# Patient Record
Sex: Male | Born: 1995
Health system: Southern US, Community
[De-identification: ages and names within clinical notes are randomized; demographics above are authoritative.]

## PROBLEM LIST (undated history)

## (undated) DIAGNOSIS — K5090 Crohn's disease, unspecified, without complications: Secondary | ICD-10-CM

## (undated) DIAGNOSIS — Z87820 Personal history of traumatic brain injury: Secondary | ICD-10-CM

## (undated) DIAGNOSIS — F419 Anxiety disorder, unspecified: Secondary | ICD-10-CM

## (undated) DIAGNOSIS — Z5189 Encounter for other specified aftercare: Secondary | ICD-10-CM

## (undated) HISTORY — DX: Anxiety disorder, unspecified: F41.9

---
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
## 2013-06-16 ENCOUNTER — Ambulatory Visit (HOSPITAL_COMMUNITY): Payer: BC Managed Care – PPO | Admitting: Behavioral Health

## 2013-07-08 ENCOUNTER — Ambulatory Visit (INDEPENDENT_AMBULATORY_CARE_PROVIDER_SITE_OTHER): Payer: BC Managed Care – PPO | Admitting: Behavioral Health

## 2013-07-08 ENCOUNTER — Encounter (HOSPITAL_COMMUNITY): Payer: Self-pay | Admitting: Behavioral Health

## 2013-07-08 ENCOUNTER — Encounter (INDEPENDENT_AMBULATORY_CARE_PROVIDER_SITE_OTHER): Payer: Self-pay

## 2013-07-08 DIAGNOSIS — F411 Generalized anxiety disorder: Secondary | ICD-10-CM

## 2013-07-08 NOTE — Progress Notes (Signed)
Presenting Problem Chief Complaint: The client presents with anxiety. He started his freshman year at school and was there 2 weeks and felt that he could not deal with the stress related to that. 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 He indicates that he knows his short-term and working there for one month has let him to want to go back to school but he is miserable and his current job. He does report that he has a tendency to catastrophize in his thinking which creates additional anxiety. The client is working a part-time job while deciding where to go back to school. He indicates that he gets up in the morning with a dread of going to work. He has decided that he will return to school has applied to [employer] [place] at [place] and [place]. We did speak with his mother after the session he was in favor of him possibly finding another part-time job as he will begin school probably in January of 2015.  What are the main stressors in your life right now? Anxiety   2  How long have you had these symptoms?: On and off for several years but had increased in intensity since August of 2014   Previous mental health services Have you ever been treated for a mental health problem? No  If Yes, when?  , where? , by whom?    Are you currently seeing a therapist or counselor? No If Yes, whom?   Have you ever had a mental health hospitalization? No If Yes, when?  , where? , why? , how many times?   Have you ever been treated with medication for a mental health problem? No If Yes, please list as completely as possible (name of medication, reason prescribed, and response:   Have you ever had suicidal thoughts or attempted suicide? No If Yes, when?   Describe   Risk factors for Suicide Demographic factors:  Adolescent or young adult/caucasian Current mental status: No suicidal ideation Loss factors: Decrease in vocational  status Historical factors: No history of suicide ideation or attempts Risk Reduction factors: Living with another person, especially a relative Clinical factors:  Severe Anxiety and/or Agitation Cognitive features that contribute to risk:     SUICIDE RISK:  Minimal: No identifiable suicidal ideation.  Patients presenting with no risk factors but with morbid ruminations; may be classified as minimal risk based on the severity of the depressive symptoms   Medical history Medical treatment and/or problems: See note in epic If Yes, please explain  Name of primary care physician/last physical exam: Dr. Alto Denver  Chronic pain issues: No If Yes, please explain  Allergies: None reported If yes, what medications are you allergic to and what happened when taking the medication? See note in epic   Current medications: See note in epic Prescribed by:   Is there any history of mental health problems or substance abuse in your family? None reported If Yes, please explain (include information on parents, siblings, aunts/uncles, grandparents, cousins, etc.):  Has anyone in your family been hospitalized for mental health problems?  If Yes, please explain (including who, where, and for what length of time):    Social/family history Who lives in your current household? The client, his mother, and his father. He has an older sister who is in college and her older brother who is out of college  Military history: Have you ever been in the  military? No If Yes, when?  for how long?   Were you ever in active combat?  If Yes, when?  for how long?  Were there any lasting effects on you?  If Yes, please explain:   Religious/spiritual involvement:  What Religion are you? Not discussed  Family of origin (childhood history)  Where were you born? [place] Where did you grow up? [place] Describe the household where you grew up: The client reports that he is very family and was nothing significant  were difficult about his childhood Do you have siblings, step/half siblings? Yes If Yes, please list names, sex and ages: An older sister and brother  Are your parents separated/divorced? No If Yes, approximately when?   Are you presently: Single How many times have you been married?  Dates of previous marriages:  Do you have any concerns regarding marriage?  If Yes, please explain:   Do you have any children? No If Yes, how many?  Please list their sexes and ages:   Social supports (personal and professional): The client reports that his family friends and girlfriend are all very supportive Education How many grades have you completed? some college Do you hold any Degrees? No If Yes, in what?   From where?  What were your special talents/interests in school?   Did you have any problems in school? No If Yes, were these problems behavioral, attention, or due to learning difficulties?  Were any medications ever prescribed for these problems?  If Yes, what were the medications?    Employment (financial issues) Do you work? Yes If Yes, what is your occupation? The client works temporarily for a company that [employer] for [employer] long have you been employed there? 4 weeks  Name of employer: [place] Do you enjoy your present job? No What is your previous work history? The client is a part-time jobs while in high school Are you having trouble on your present job or had difficulties holding a job? No If Yes, please explain:    Legal history Do you have any current legal issues? If yes, please describe: None reported  Do you have any [ast legal issues? If yes, please describe: None reported  Trauma/Abuse history: Have you ever been exposed to any form of abuse? None reported If Yes: None reported  Have you ever been exposed to something traumatic? No If yes, please described:    Substance use Do you use Caffeine? Yes If Yes, what type? Coffee soft  drinks How often? A few per week  Do you use Nicotine? No What type?  Packs per day  How many years at this frequency?   Do you use Alcohol? Yes If Yes, what type? Primarily beer Frequency? A few per week  At what age did you take your first drink? 16 Was this accepted by your family? Yes  When was your last drink? A few days ago How much? One beer  Have you ever experienced any form of withdrawal symptoms, i.e., Hallucinations, Tremors, Excessive Sweating, or Nausea or Vomiting? No If Yes, please explain:   Have you ever experienced blackouts? No If Yes, how frequently?   Have you ever had a DWI/DUI? No If Yes, when?   Do you have any legal charges pending involving substance abuse? No If Yes, please explain:   Have you ever used illicit drugs or taken more than prescribed? No If Yes, what type?  Frequency:   Date of last usage:   Have you ever  experienced any withdrawal symptoms as listed above? No If Yes, please explain:   If you are not using presently, have you ever used in the past? No  If Yes, what types of Alcohol or other substances have you used?  Frequency  Last used:   Have you ever received treatment for Alcohol or Substance Abuse problems? No  Inpatient? No Outpatient? No What were the dates of treatment?  Where?   Have you ever been involved in any Recovery or Support Programs? No  If Yes, where?   Are you aware of your triggers to drink or use? No If Yes, please explain:   Mental Status: General Appearance Luretha Murphy:  Neat Eye Contact:  Good Motor Behavior:  Normal Speech:  Normal Level of Consciousness:  Alert Mood:  Anxious Affect:  Appropriate Anxiety Level:  Moderate Thought Process:  Coherent Thought Content:   Perception:  Normal Judgment:  Good Insight:  Present Cognition:  Orientation time Sleep: Client reports no issues with sleep  Diagnosis AXIS I 300.02  AXIS II Deferred  AXIS III Past Medical History  Diagnosis Date   . History of concussion   . Anxiety     AXIS IV educational problems/work  AXIS V 51-60 moderate symptoms    Plan: To work with decline in processing his sources of anxiety and to provide coping skills using cognitive behavioral therapy to reduce anxiety   __________________________________________ Signature/Date

## 2013-07-22 ENCOUNTER — Ambulatory Visit (HOSPITAL_COMMUNITY): Payer: Self-pay | Admitting: Behavioral Health

## 2013-08-05 ENCOUNTER — Ambulatory Visit (HOSPITAL_COMMUNITY): Payer: Self-pay | Admitting: Behavioral Health

## 2013-08-17 ENCOUNTER — Ambulatory Visit (HOSPITAL_COMMUNITY): Payer: Self-pay | Admitting: Psychiatry

## 2013-09-18 ENCOUNTER — Emergency Department (INDEPENDENT_AMBULATORY_CARE_PROVIDER_SITE_OTHER)
Admission: EM | Admit: 2013-09-18 | Discharge: 2013-09-18 | Disposition: A | Discharge status: Home / Self Care | Payer: BC Managed Care – PPO | Source: Home / Self Care | Attending: Family Medicine | Admitting: Family Medicine

## 2013-09-18 ENCOUNTER — Encounter: Payer: Self-pay | Admitting: Emergency Medicine

## 2013-09-18 DIAGNOSIS — J069 Acute upper respiratory infection, unspecified: Secondary | ICD-10-CM

## 2013-09-18 DIAGNOSIS — J029 Acute pharyngitis, unspecified: Secondary | ICD-10-CM

## 2013-09-18 DIAGNOSIS — R509 Fever, unspecified: Secondary | ICD-10-CM

## 2013-09-18 LAB — POCT RAPID STREP A (OFFICE): RAPID STREP A SCREEN: NEGATIVE

## 2013-09-18 LAB — POCT INFLUENZA A/B
INFLUENZA B, POC: NEGATIVE
Influenza A, POC: NEGATIVE

## 2013-09-18 MED ORDER — AZITHROMYCIN 250 MG PO TABS: ORAL_TABLET | ORAL | Status: DC

## 2013-09-18 MED ORDER — BENZONATATE 200 MG PO CAPS: 200.0000 mg | ORAL_CAPSULE | Freq: Every day | ORAL | Status: DC

## 2013-09-18 NOTE — ED Notes (Signed)
Miguel King complains of fevers, chills, body aches and sore throat for 4 days. He has taken Advil and Mucinex with a little relief.

## 2013-09-18 NOTE — Discharge Instructions (Signed)
Take plain Mucinex (1200 mg guaifenesin) twice daily for cough and congestion.  May add Sudafed for sinus congestion.   Increase fluid intake, rest. May use Afrin nasal spray (or generic oxymetazoline) twice daily for about 5 days.  Also recommend using saline nasal spray several times daily and saline nasal irrigation (AYR is a common brand) Try warm salt water gargles for sore throat.  Stop all antihistamines for now, and other non-prescription cough/cold preparations. May take Ibuprofen 200mg , 4 tabs every 8 hours with food for sore throat, body aches, chest discomfort, etc. Begin Azithromycin if not improving about 5 days or if persistent fever develops Follow-up with family doctor if not improving 7 to 10 days.

## 2013-09-18 NOTE — ED Provider Notes (Signed)
CSN: [id number]     Arrival date & time 09/18/13  1157 History   First MD Initiated Contact with Patient 09/18/13 1314     Chief Complaint  Patient presents with  . Generalized Body Aches    x 4 days  . Sore Throat    x 4 days  . Fever    x 4 days      HPI Comments: Four days ago patient developed myalgias and fatigue, and two days ago he developed a mild sore throat and chills.  A non-productive cough then developed.  The history is provided by the patient.    Past Medical History  Diagnosis Date  . History of concussion   . Anxiety    History reviewed. No pertinent past surgical history. History reviewed. No pertinent family history. History  Substance Use Topics  . Smoking status: Never Smoker   . Smokeless tobacco: Never Used  . Alcohol Use: 1.2 oz/week    2 Cans of beer per week    Review of Systems + sore throat + cough No pleuritic pain but has tightness in anterior chest No wheezing No nasal congestion ? post-nasal drainage No sinus pain/pressure No itchy/red eyes No earache No hemoptysis No SOB ? fever, + chills No nausea No vomiting No abdominal pain No diarrhea No urinary symptoms No skin rash + fatigue + myalgias No headache Used OTC meds without relief  Allergies  Review of patient's allergies indicates no known allergies.  Home Medications   Current Outpatient Rx  Name  Route  Sig  Dispense  Refill  . guaiFENesin (MUCINEX) 600 MG 12 hr tablet   Oral   Take by mouth 2 (two) times daily.         Marland Kitchen. ibuprofen (ADVIL,MOTRIN) 200 MG tablet   Oral   Take 200 mg by mouth every 6 (six) hours as needed.         Marland Kitchen. azithromycin (ZITHROMAX Z-PAK) 250 MG tablet      Take 2 tabs today; then begin one tab once daily for 4 more days. (Rx void after 09/26/13)   6 each   0   . benzonatate (TESSALON) 200 MG capsule   Oral   Take 1 capsule (200 mg total) by mouth at bedtime. Take as needed for cough   12 capsule   0    BP 115/75  Pulse  106  Temp(Src) 98.6 F (37 C) (Oral)  Ht 6\' 1"  (1.854 m)  Wt 180 lb (81.647 kg)  BMI 23.75 kg/m2  SpO2 100% Physical Exam Nursing notes and Vital Signs reviewed. Appearance:  Patient appears healthy, stated age, and in no acute distress Eyes:  Pupils are equal, round, and reactive to light and accomodation.  Extraocular movement is intact.  Conjunctivae are not inflamed  Ears:  Canals normal.  Tympanic membranes normal.  Nose:  Mildly congested turbinates.  No sinus tenderness.   Pharynx:  Normal Neck:  Supple.   Tender shotty posterior nodes are palpated bilaterally  Lungs:  Clear to auscultation.  Breath sounds are equal.  Chest:  Distinct tenderness to palpation over the mid-sternum.  Heart:  Regular rate and rhythm without murmurs, rubs, or gallops.  Abdomen:  Nontender without masses or hepatosplenomegaly.  Bowel sounds are present.  No CVA or flank tenderness.  Extremities:  No edema.  No calf tenderness Skin:  No rash present.   ED Course  Procedures  none    Labs Reviewed  POCT RAPID STREP A (OFFICE)  negative  POCT INFLUENZA A/B negative         MDM   1. Fever   2. Sore throat   3. Acute upper respiratory infections of unspecified site; suspect viral URI    There is no evidence of bacterial infection today. Treat symptomatically for now. Prescription written for Benzonatate [facility]) to take at bedtime for night-time cough.  Take plain Mucinex (1200 mg guaifenesin) twice daily for cough and congestion.  May add Sudafed for sinus congestion.   Increase fluid intake, rest. May use Afrin nasal spray (or generic oxymetazoline) twice daily for about 5 days.  Also recommend using saline nasal spray several times daily and saline nasal irrigation (AYR is a common brand) Try warm salt water gargles for sore throat.  Stop all antihistamines for now, and other non-prescription cough/cold preparations. May take Ibuprofen 200mg , 4 tabs every 8 hours with food for sore  throat, body aches, chest discomfort, etc. Begin Azithromycin if not improving about 5 days or if persistent fever develops (Given a prescription to hold, with an expiration date)  Follow-up with family doctor if not improving 7 to 10 days.     Lattie Haw, MD 09/21/13 [phone]

## 2013-10-03 ENCOUNTER — Encounter (HOSPITAL_COMMUNITY): Payer: Self-pay | Admitting: Behavioral Health

## 2013-10-03 NOTE — Progress Notes (Unsigned)
Patient ID: Miguel BrennerHunter King, male   DOB: 1996-03-27, 18 y.o.   MRN: [id number]  Outpatient Therapist Discharge Summary  Miguel BrennerHunter King    1996-03-27   Admission Date: 07/08/13   Discharge Date:  10/03/13 Reason for Discharge:  Client did not return Diagnosis:  Axis I:  300.02  Axis II:  deferred  Axis III:  none reported  Axis IV:    Axis V:  ***  Comments:    French Anaraig M Leiani Enright

## 2015-02-02 ENCOUNTER — Encounter: Payer: Self-pay | Admitting: Emergency Medicine

## 2015-02-02 ENCOUNTER — Emergency Department
Admission: EM | Admit: 2015-02-02 | Discharge: 2015-02-02 | Disposition: A | Discharge status: Home / Self Care | Payer: BLUE CROSS/BLUE SHIELD | Source: Home / Self Care | Attending: Emergency Medicine | Admitting: Emergency Medicine

## 2015-02-02 DIAGNOSIS — W57XXXA Bitten or stung by nonvenomous insect and other nonvenomous arthropods, initial encounter: Secondary | ICD-10-CM | POA: Diagnosis not present

## 2015-02-02 DIAGNOSIS — R21 Rash and other nonspecific skin eruption: Secondary | ICD-10-CM | POA: Diagnosis not present

## 2015-02-02 DIAGNOSIS — T148 Other injury of unspecified body region: Secondary | ICD-10-CM

## 2015-02-02 MED ORDER — DOXYCYCLINE HYCLATE 100 MG PO CAPS: 100.0000 mg | ORAL_CAPSULE | Freq: Two times a day (BID) | ORAL | Status: DC

## 2015-02-02 NOTE — ED Provider Notes (Signed)
CSN: [id number]     Arrival date & time 02/02/15  1023 History   First MD Initiated Contact with Patient 02/02/15 1054     Chief Complaint  Patient presents with  . Insect Bite   (Consider location/radiation/quality/duration/timing/severity/associated sxs/prior Treatment) Patient is a 19 y.o. male presenting with rash. The history is provided by the patient. No language interpreter was used.  Rash Location:  Leg Leg rash location:  R upper leg Quality: redness and swelling   Severity:  Moderate Onset quality:  Gradual Duration:  1 day Timing:  Constant Progression:  Worsening Chronicity:  New Context: sick contacts   Relieved by:  Nothing Ineffective treatments:  None tried Associated symptoms: no fever and no nausea     Past Medical History  Diagnosis Date  . History of concussion   . Anxiety    History reviewed. No pertinent past surgical history. History reviewed. No pertinent family history. History  Substance Use Topics  . Smoking status: Never Smoker   . Smokeless tobacco: Never Used  . Alcohol Use: 1.2 oz/week    2 Cans of beer per week    Review of Systems  Constitutional: Negative for fever.  Gastrointestinal: Negative for nausea.  Skin: Positive for rash.  All other systems reviewed and are negative.   Allergies  Review of patient's allergies indicates no known allergies.  Home Medications   Prior to Admission medications   Medication Sig Start Date End Date Taking? Authorizing Provider  azithromycin (ZITHROMAX Z-PAK) 250 MG tablet Take 2 tabs today; then begin one tab once daily for 4 more days. (Rx void after 09/26/13) 09/18/13   Lattie HawStephen A Beese, MD  benzonatate (TESSALON) 200 MG capsule Take 1 capsule (200 mg total) by mouth at bedtime. Take as needed for cough 09/18/13   Lattie HawStephen A Beese, MD  doxycycline (VIBRAMYCIN) 100 MG capsule Take 1 capsule (100 mg total) by mouth 2 (two) times daily. 02/02/15   Elson AreasLeslie K Shone Leventhal, PA-C  guaiFENesin (MUCINEX) 600 MG  12 hr tablet Take by mouth 2 (two) times daily.    Historical Provider, MD  ibuprofen (ADVIL,MOTRIN) 200 MG tablet Take 200 mg by mouth every 6 (six) hours as needed.    Historical Provider, MD   BP 103/68 mmHg  Pulse 74  Temp(Src) 98.2 F (36.8 C) (Oral)  Resp 16  Ht 6\' 1"  (1.854 m)  Wt 170 lb (77.111 kg)  BMI 22.43 kg/m2  SpO2 100% Physical Exam  Constitutional: He is oriented to person, place, and time. He appears well-developed and well-nourished.  HENT:  Head: Normocephalic.  Eyes: EOM are normal.  Neck: Normal range of motion.  Pulmonary/Chest: Effort normal.  Abdominal: He exhibits no distension.  Musculoskeletal: He exhibits tenderness.  12 cm red area with 5dm white area with red center (bull'seye)   Neurological: He is alert and oriented to person, place, and time.  Psychiatric: He has a normal mood and affect.  Nursing note and vitals reviewed.   ED Course  Procedures (including critical care time) Labs Review Labs Reviewed - No data to display  Imaging Review No results found.   MDM  Large target lesion.   Pt feels well.   I will treat with doxycycline Pt advised to see his MD or recheck in 1 week   1. Insect bite   2. Rash and nonspecific skin eruption     AVS Doxycycline   Elson AreasLeslie K Mackinze Criado, PA-C 02/02/15 1144

## 2015-02-02 NOTE — ED Notes (Signed)
Awoke this morning and notice lump on back of upper right thigh; visualized large reddened area at site.

## 2015-02-02 NOTE — Discharge Instructions (Signed)
Tick Bite Information Ticks are insects that attach themselves to the skin and draw blood for food. There are various types of ticks. Common types include wood ticks and deer ticks. Most ticks live in shrubs and grassy areas. Ticks can climb onto your body when you make contact with leaves or grass where the tick is waiting. The most common places on the body for ticks to attach themselves are the scalp, neck, armpits, waist, and groin. Most tick bites are harmless, but sometimes ticks carry germs that cause diseases. These germs can be spread to a person during the tick's feeding process. The chance of a disease spreading through a tick bite depends on:   The type of tick.  Time of year.   How long the tick is attached.   Geographic location.  HOW CAN YOU PREVENT TICK BITES? Take these steps to help prevent tick bites when you are outdoors:  Wear protective clothing. Long sleeves and long pants are best.   Wear white clothes so you can see ticks more easily.  Tuck your pant legs into your socks.   If walking on a trail, stay in the middle of the trail to avoid brushing against bushes.  Avoid walking through areas with long grass.  Put insect repellent on all exposed skin and along boot tops, pant legs, and sleeve cuffs.   Check clothing, hair, and skin repeatedly and before going inside.   Brush off any ticks that are not attached.  Take a shower or bath as soon as possible after being outdoors.  WHAT IS THE PROPER WAY TO REMOVE A TICK? Ticks should be removed as soon as possible to help prevent diseases caused by tick bites. 1. If latex gloves are available, put them on before trying to remove a tick.  2. Using fine-point tweezers, grasp the tick as close to the skin as possible. You may also use curved forceps or a tick removal tool. Grasp the tick as close to its head as possible. Avoid grasping the tick on its body. 3. Pull gently with steady upward pressure until  the tick lets go. Do not twist the tick or jerk it suddenly. This may break off the tick's head or mouth parts. 4. Do not squeeze or crush the tick's body. This could force disease-carrying fluids from the tick into your body.  5. After the tick is removed, wash the bite area and your hands with soap and water or other disinfectant such as alcohol. 6. Apply a small amount of antiseptic cream or ointment to the bite site.  7. Wash and disinfect any instruments that were used.  Do not try to remove a tick by applying a hot match, petroleum jelly, or fingernail polish to the tick. These methods do not work and may increase the chances of disease being spread from the tick bite.  WHEN SHOULD YOU SEEK MEDICAL CARE? Contact your health care provider if you are unable to remove a tick from your skin or if a part of the tick breaks off and is stuck in the skin.  After a tick bite, you need to be aware of signs and symptoms that could be related to diseases spread by ticks. Contact your health care provider if you develop any of the following in the days or weeks after the tick bite:  Unexplained fever.  Rash. A circular rash that appears days or weeks after the tick bite may indicate the possibility of Lyme disease. The rash may resemble  a target with a bull's-eye and may occur at a different part of your body than the tick bite.  Redness and swelling in the area of the tick bite.   Tender, swollen lymph glands.   Diarrhea.   Weight loss.   Cough.   Fatigue.   Muscle, joint, or bone pain.   Abdominal pain.   Headache.   Lethargy or a change in your level of consciousness.  Difficulty walking or moving your legs.   Numbness in the legs.   Paralysis.  Shortness of breath.   Confusion.   Repeated vomiting.  Document Released: 08/15/2000 Document Revised: 06/08/2013 Document Reviewed: 01/26/2013 [facility] Patient Information 2015 [place], [place]. This information is  not intended to replace advice given to you by your health care provider. Make sure you discuss any questions you have with your health care provider. Insect Bite Mosquitoes, flies, fleas, bedbugs, and many other insects can bite. Insect bites are different from insect stings. A sting is when venom is injected into the skin. Some insect bites can transmit infectious diseases. SYMPTOMS  Insect bites usually turn red, swell, and itch for 2 to 4 days. They often go away on their own. TREATMENT  Your caregiver may prescribe antibiotic medicines if a bacterial infection develops in the bite. HOME CARE INSTRUCTIONS  Do not scratch the bite area.  Keep the bite area clean and dry. Wash the bite area thoroughly with soap and water.  Put ice or cool compresses on the bite area.  Put ice in a plastic bag.  Place a towel between your skin and the bag.  Leave the ice on for 20 minutes, 4 times a day for the first 2 to 3 days, or as directed.  You may apply a baking soda paste, cortisone cream, or calamine lotion to the bite area as directed by your caregiver. This can help reduce itching and swelling.  Only take over-the-counter or prescription medicines as directed by your caregiver.  If you are given antibiotics, take them as directed. Finish them even if you start to feel better. You may need a tetanus shot if:  You cannot remember when you had your last tetanus shot.  You have never had a tetanus shot.  The injury broke your skin. If you get a tetanus shot, your arm may swell, get red, and feel warm to the touch. This is common and not a problem. If you need a tetanus shot and you choose not to have one, there is a rare chance of getting tetanus. Sickness from tetanus can be serious. SEEK IMMEDIATE MEDICAL CARE IF:  8. You have increased pain, redness, or swelling in the bite area. 9. You see a red line on the skin coming from the bite. 10. You have a fever. 11. You have joint  pain. 12. You have a headache or neck pain. 13. You have unusual weakness. 14. You have a rash. 15. You have chest pain or shortness of breath. 16. You have abdominal pain, nausea, or vomiting. 17. You feel unusually tired or sleepy. MAKE SURE YOU:   Understand these instructions.  Will watch your condition.  Will get help right away if you are not doing well or get worse. Document Released: 09/25/2004 Document Revised: 11/10/2011 Document Reviewed: 03/19/2011 [facility] Patient Information 2015 [place], [place]. This information is not intended to replace advice given to you by your health care provider. Make sure you discuss any questions you have with your health care provider.

## 2015-02-03 ENCOUNTER — Emergency Department (INDEPENDENT_AMBULATORY_CARE_PROVIDER_SITE_OTHER)
Admission: EM | Admit: 2015-02-03 | Discharge: 2015-02-03 | Disposition: A | Discharge status: Home / Self Care | Payer: BLUE CROSS/BLUE SHIELD | Source: Home / Self Care | Attending: Sports Medicine | Admitting: Sports Medicine

## 2015-02-03 ENCOUNTER — Encounter: Payer: Self-pay | Admitting: Emergency Medicine

## 2015-02-03 DIAGNOSIS — L03115 Cellulitis of right lower limb: Secondary | ICD-10-CM

## 2015-02-03 MED ORDER — CEFTRIAXONE SODIUM 1 G IJ SOLR
1.0000 g | Freq: Once | INTRAMUSCULAR | Status: AC
  Administered 2015-02-03: 1 g via INTRAMUSCULAR

## 2015-02-03 NOTE — ED Provider Notes (Signed)
  Subjective:    CC: Skin rash  HPI: This is a pleasant 19 year old male, [facility] Yesterday he was seen with a rash in the right inner thigh, is a questionable history of a tick bite. He was placed on doxycycline 100 mg twice a day for 10 days, he returns today with worsening of the rash, he has had 3 doses so far of doxycycline. No constitutional symptoms, pain is mild, worsening. He's also noted a lump in his right groin.  Past medical history, Surgical history, Family history not pertinant except as noted below, Social history, Allergies, and medications have been entered into the medical record, reviewed, and no changes needed.   Review of Systems: No fevers, chills, night sweats, weight loss, chest pain, or shortness of breath.   Objective:    General: Well Developed, well nourished, and in no acute distress.  Neuro: Alert and oriented x3, extra-ocular muscles intact, sensation grossly intact.  HEENT: Normocephalic, atraumatic, pupils equal round reactive to light, neck supple, no masses, no lymphadenopathy, thyroid nonpalpable.  Skin: Warm and dry, there is approximately a 5-6 cm area of erythema with mild induration, on the inner thigh with mild lymphangitis and inguinal lymphadenopathy that is minimally tender. There is no bull's-eye rash, I'm unable to palpate any signs of fluctuance. Cardiac: Regular rate and rhythm, no murmurs rubs or gallops, no lower extremity edema.  Respiratory: Clear to auscultation bilaterally. Not using accessory muscles, speaking in full sentences.  Impression and Recommendations:    1. Skin and soft tissue infection, suspect cellulitis with lymphangitis and lymphadenitis. He is well covered for typical cutaneous organisms, with doxycycline. We are going to give him ceftriaxone 1000 g intramuscular, he will do warm compresses every hour, and should be seen within 7 days right any provider.  Monica Bectonhomas J Pricsilla Lindvall, MD 02/03/15 1414

## 2015-02-03 NOTE — ED Notes (Signed)
Pt was seen here yesterday for rash on left leg. States it has gotten worse and is warm red and tender. He has had 3 doses of his doxy.

## 2015-02-03 NOTE — Discharge Instructions (Signed)

## 2015-11-27 ENCOUNTER — Emergency Department
Admission: EM | Admit: 2015-11-27 | Discharge: 2015-11-27 | Disposition: A | Discharge status: Home / Self Care | Payer: BLUE CROSS/BLUE SHIELD | Source: Home / Self Care | Attending: Family Medicine | Admitting: Family Medicine

## 2015-11-27 ENCOUNTER — Encounter: Payer: Self-pay | Admitting: *Deleted

## 2015-11-27 DIAGNOSIS — K921 Melena: Secondary | ICD-10-CM

## 2015-11-27 MED ORDER — HYDROCORTISONE ACETATE 25 MG RE SUPP: 25.0000 mg | Freq: Two times a day (BID) | RECTAL | Status: DC

## 2015-11-27 NOTE — Discharge Instructions (Signed)

## 2015-11-27 NOTE — ED Provider Notes (Signed)
CSN: [id number]     Arrival date & time 11/27/15  1758 History   First MD Initiated Contact with Patient 11/27/15 1925     Chief Complaint  Patient presents with  . Hematochezia      HPI Comments: Patient reports that he had a normal bowel movement today coated with bright red blood.  He denies rectal pain, abdominal or pelvic pain.  No history of hemorrhoids.  His bowel movements have otherwise been normal and regular.  He feels well. He has a past history of resection of pilonidal cyst.  The history is provided by the patient.    Past Medical History  Diagnosis Date  . History of concussion   . Anxiety    History reviewed. No pertinent past surgical history. History reviewed. No pertinent family history. Social History  Substance Use Topics  . Smoking status: Never Smoker   . Smokeless tobacco: Never Used  . Alcohol Use: 1.2 oz/week    2 Cans of beer per week    Review of Systems  Constitutional: Negative for fever, chills, diaphoresis, activity change, appetite change, fatigue and unexpected weight change.  Gastrointestinal: Positive for blood in stool. Negative for nausea, vomiting, abdominal pain, diarrhea, constipation, abdominal distention, anal bleeding and rectal pain.  All other systems reviewed and are negative.   Allergies  Review of patient's allergies indicates no known allergies.  Home Medications   Prior to Admission medications   Not on File   Meds Ordered and Administered this Visit  Medications - No data to display  BP 119/68 mmHg  Pulse 71  Temp(Src) 98.1 F (36.7 C) (Oral)  Resp 16  Ht 6' (1.829 m)  Wt 181 lb (82.101 kg)  BMI 24.54 kg/m2  SpO2 98% No data found.   Physical Exam Nursing notes and Vital Signs reviewed. Appearance:  Patient appears stated age, and in no acute distress Eyes:  Pupils are equal, round, and reactive to light and accomodation.  Extraocular movement is intact.  Conjunctivae are not inflamed  Nose:   Normal Mouth/Pharynx:  Normal Neck:  Supple.  No adenopathy  Lungs:  Clear to auscultation.  Breath sounds are equal.  Moving air well. Heart:  Regular rate and rhythm without murmurs, rubs, or gallops.  Abdomen:  Nontender without masses or hepatosplenomegaly.  Bowel sounds are present.  No CVA or flank tenderness.  Extremities:  No edema.  Skin:  No rash present.   Rectal Exam:  Anus is normal without surrounding erythema.  No external hemorrhoids are present.  No lesions are palpated in the rectal vault.  No pain elicited during digital exam.  Stool is heme positive.  Prostate is not enlarged, and symmetric without tenderness or nodules.  ED Course  Procedures none     MDM   1. Hematochezia; ?etiology.  Rectal exam unremarkable    Rx for [facility] suppositories BID Recommend evaluation by gastroenterologist   Lattie HawStephen A Shanetra Blumenstock, MD 12/03/15 [phone]

## 2015-11-27 NOTE — ED Notes (Signed)
Pt reports BM today with bright red blood. The BM was normal. Denies fever, abd pain, hemorrhoid, or rectal pain.

## 2018-08-30 ENCOUNTER — Other Ambulatory Visit: Payer: Self-pay

## 2018-08-30 ENCOUNTER — Emergency Department
Admission: EM | Admit: 2018-08-30 | Discharge: 2018-08-30 | Disposition: A | Discharge status: Home / Self Care | Payer: BLUE CROSS/BLUE SHIELD | Source: Home / Self Care | Attending: Family Medicine | Admitting: Family Medicine

## 2018-08-30 DIAGNOSIS — M2669 Other specified disorders of temporomandibular joint: Secondary | ICD-10-CM

## 2018-08-30 MED ORDER — PREDNISONE 20 MG PO TABS: ORAL_TABLET | ORAL | 0 refills | Status: DC

## 2018-08-30 NOTE — Discharge Instructions (Addendum)
Apply ice pack for 10 to 15 minutes, 3 to 4 times daily  Continue until pain and swelling decrease.  May take Tylenol for pain, if needed.

## 2018-08-30 NOTE — ED Provider Notes (Signed)
[facility]    CSN: [id number] Arrival date & time: 08/30/18  1727     History   Chief Complaint Chief Complaint  Patient presents with  . Lock jaw    HPI Miguel King is a 22 y.o. male.   Patient reports that he woke up one month ago with his jaw "locked."  He was able to "pop" it free.  Five days ago his jaw locked again, and he has had difficulty fully opening his mouth since then.  He notes pain in his right TMJ area.     Past Medical History:  Diagnosis Date  . Anxiety   . History of concussion     Patient Active Problem List   Diagnosis Date Noted  . FRACTURE, FINGER 03/19/2009  . FINGER SPRAIN 03/19/2009    History reviewed. No pertinent surgical history.     Home Medications    Prior to Admission medications   Medication Sig Start Date End Date Taking? Authorizing Provider  hydrocortisone (ANUSOL-HC) 25 MG suppository Place 1 suppository (25 mg total) rectally 2 (two) times daily. 11/27/15   Lattie HawBeese, Ameena Vesey A, MD  predniSONE (DELTASONE) 20 MG tablet Take one tab by mouth twice daily for 4 days, then one daily. Take with food. 08/30/18   Lattie HawBeese, Mariko Nowakowski A, MD  cetirizine (ZYRTEC) 10 MG tablet Take 10 mg by mouth daily.  09/18/13  [provider]    Family History History reviewed. No pertinent family history.  Social History Social History   Tobacco Use  . Smoking status: Never Smoker  . Smokeless tobacco: Never Used  Substance Use Topics  . Alcohol use: Yes    Alcohol/week: 2.0 standard drinks    Types: 2 Cans of beer per week  . Drug use: No     Allergies   Patient has no known allergies.   Review of Systems Review of Systems  Constitutional: Negative for activity change, appetite change, chills, diaphoresis, fatigue and fever.  HENT: Negative for congestion, ear discharge, ear pain, facial swelling, sinus pressure and trouble swallowing.   Eyes: Negative.   All other systems reviewed and are  negative.    Physical Exam Triage Vital Signs ED Triage Vitals [08/30/18 1749]  Enc Vitals Group     BP 124/81     Pulse Rate 100     Resp 18     Temp 98 F (36.7 C)     Temp Source Oral     SpO2 98 %     Weight 176 lb (79.8 kg)     Height 6\' 2"  (1.88 m)     Head Circumference      Peak Flow      Pain Score 0     Pain Loc      Pain Edu?      Excl. in GC?    No data found.  Updated Vital Signs BP 124/81 (BP Location: Right Arm)   Pulse 100   Temp 98 F (36.7 C) (Oral)   Resp 18   Ht 6\' 2"  (1.88 m)   Wt 79.8 kg   SpO2 98%   BMI 22.60 kg/m   Visual Acuity Right Eye Distance:   Left Eye Distance:   Bilateral Distance:    Right Eye Near:   Left Eye Near:    Bilateral Near:     Physical Exam Vitals signs and nursing note reviewed.  Constitutional:      General: He is not in acute distress.  Appearance: Normal appearance. He is not ill-appearing.  HENT:     Head: Normocephalic.     Right Ear: Tympanic membrane, ear canal and external ear normal.     Left Ear: Tympanic membrane, ear canal and external ear normal.     Ears:     Comments: There is distinct tenderness over the right temporomandibular joint.  Palpation there recreates his pain.      Nose: Nose normal. No congestion.     Mouth/Throat:     Pharynx: Oropharynx is clear.     Comments: Patient has difficulty fully opening his mouth because of right TMJ pain. Eyes:     Pupils: Pupils are equal, round, and reactive to light.  Neck:     Musculoskeletal: Neck supple. No muscular tenderness.  Cardiovascular:     Rate and Rhythm: Normal rate.  Pulmonary:     Effort: Pulmonary effort is normal.  Lymphadenopathy:     Cervical: No cervical adenopathy.  Skin:    General: Skin is warm and dry.     Findings: No rash.  Neurological:     Mental Status: He is alert.      UC Treatments / Results  Labs (all labs ordered are listed, but only abnormal results are displayed) Labs Reviewed - No data to  display  EKG None  Radiology No results found.  Procedures Procedures (including critical care time)  Medications Ordered in UC Medications - No data to display  Initial Impression / Assessment and Plan / UC Course  I have reviewed the triage vital signs and the nursing notes.  Pertinent labs & imaging results that were available during my care of the patient were reviewed by me and considered in my medical decision making (see chart for details).    Begin prednisone burst/taper. Followup with ENT as soon as possible.    Final Clinical Impressions(s) / UC Diagnoses   Final diagnoses:  TMJ locking     Discharge Instructions     Apply ice pack for 10 to 15 minutes, 3 to 4 times daily  Continue until pain and swelling decrease.  May take Tylenol for pain, if needed.    ED Prescriptions    Medication Sig Dispense Auth. Provider   predniSONE (DELTASONE) 20 MG tablet Take one tab by mouth twice daily for 4 days, then one daily. Take with food. 12 tablet Lattie HawBeese, Cyle Kenyon A, MD         Lattie HawBeese, Osamah Schmader A, MD 09/07/18 1059

## 2018-08-30 NOTE — ED Triage Notes (Signed)
Pt stated that about 1 month ago he woke up with his jaw locked.  He was able to "pop" it.  Christmas day his jaw locked again, and he is only able to open his mouth slightly.

## 2018-08-31 ENCOUNTER — Telehealth: Payer: Self-pay | Admitting: *Deleted

## 2018-08-31 NOTE — Telephone Encounter (Signed)
Called pt to notify him that [facility] and there office is closed today. Gave pt there phone number and he will call them on 09/02/2017 to schedule an appt. Pt verbalized understanding.

## 2019-02-22 DIAGNOSIS — Z20828 Contact with and (suspected) exposure to other viral communicable diseases: Secondary | ICD-10-CM | POA: Diagnosis not present

## 2019-03-10 ENCOUNTER — Encounter: Payer: Self-pay | Admitting: Family Medicine

## 2019-03-10 ENCOUNTER — Telehealth (INDEPENDENT_AMBULATORY_CARE_PROVIDER_SITE_OTHER): Payer: BC Managed Care – PPO | Admitting: Family Medicine

## 2019-03-10 VITALS — HR 70 | Ht 74.0 in | Wt 173.0 lb

## 2019-03-10 DIAGNOSIS — R197 Diarrhea, unspecified: Secondary | ICD-10-CM | POA: Diagnosis not present

## 2019-03-10 DIAGNOSIS — R109 Unspecified abdominal pain: Secondary | ICD-10-CM | POA: Diagnosis not present

## 2019-03-10 NOTE — Assessment & Plan Note (Signed)
Unclear etiology at this point.  His initial symptoms particularly triggered by stress sounded more like IBS.  Stress and then certain foods like pizza were a big trigger.  He does get abdominal pain around the time of the bowel movement.  He says the pain can last a couple hours after the bowel movement but then seems to ease off.  But the persistence of the diarrhea over the last couple of months is a little bit unusual for typical IBS D.  Thus I would like to work him up further.  Will check for celiac disease as well as Crohn's disease.  We will check evaluate for nutritional deficiencies as well as anemia.  Also check liver enzymes.

## 2019-03-10 NOTE — Assessment & Plan Note (Signed)
See note above.  We will also do stool cultures to evaluate for C. difficile, bacteria, and Giardia.

## 2019-03-10 NOTE — Progress Notes (Signed)
Spoke w/pt and he stated that this has been going on for months. He stated that he noticed that anytime his is under a lot of stress it seems to be worse. He stated that when he wakes up he goes to the bathroom and about 10 mins later he goes again and this goes on for up to 6x daily. He has attempted to make changes in his diet by eliminating gluten and dairy and it has only been 2 days of this.

## 2019-03-10 NOTE — Progress Notes (Signed)
Virtual Visit via Video Note  I connected with Miguel BrennerHunter King on 03/10/19 at 10:50 AM EDT by a video enabled telemedicine application and verified that I am speaking with the correct person using two identifiers.   I discussed the limitations of evaluation and management by telemedicine and the availability of in person appointments. The patient expressed understanding and agreed to proceed.  Pt was at home and I was in my office for the virtual visit.     New Patient Office Visit  Subjective:  Patient ID: Miguel King, male    DOB: September 20, 1995  Age: 23 y.o. MRN: [id number]  CC:  Chief Complaint  Patient presents with  . Diarrhea    HPI Miguel King presents for Diarrhea.  He has had on and off diarrhea for years.  Normally it would occur when he would get stressed or when he was studying for a test.  But over the last couple months he feels like it is gotten progressively worse.  And over the last month he says every bowel movement has basically been either soft or watery.  He says sometimes when it is watery it actually has a foul over in a most smells like vomit and has sometimes a mucousy character to it.  He denies any fevers chills or sweats.  He occasionally notices a little bit of blood with wiping but nothing significantly in the toilet or the stool.  No family history of Crohn's disease etc.  He says was seen several years ago for some rectal bleeding and had a rectal exam but has never had any type of scope or procedure done.  He is studying for a test and so has been under some stress but does not feel like he is overly stressed.  Past Medical History:  Diagnosis Date  . Anxiety   . History of concussion      History reviewed. No pertinent surgical history.  History reviewed. No pertinent family history.  Social History   Socioeconomic History  . Marital status: Single    Spouse name: Not on file  . Number of children: Not on file  . Years of education: Not on file   . Highest education level: Not on file  Occupational History  . Not on file  Social Needs  . Financial resource strain: Not on file  . Food insecurity    Worry: Not on file    Inability: Not on file  . Transportation needs    Medical: Not on file    Non-medical: Not on file  Tobacco Use  . Smoking status: Never Smoker  . Smokeless tobacco: Never Used  Substance and Sexual Activity  . Alcohol use: Yes    Alcohol/week: 2.0 standard drinks    Types: 2 Cans of beer per week  . Drug use: No  . Sexual activity: Not Currently  Lifestyle  . Physical activity    Days per week: Not on file    Minutes per session: Not on file  . Stress: Not on file  Relationships  . Social Musicianconnections    Talks on phone: Not on file    Gets together: Not on file    Attends religious service: Not on file    Active member of club or organization: Not on file    Attends meetings of clubs or organizations: Not on file    Relationship status: Not on file  . Intimate partner violence    Fear of current or ex partner: Not on file  Emotionally abused: Not on file    Physically abused: Not on file    Forced sexual activity: Not on file  Other Topics Concern  . Not on file  Social History Narrative  . Not on file    ROS Review of Systems  Objective:   Today's Vitals: Pulse 70   Ht 6\' 2"  (1.88 m)   Wt 173 lb (78.5 kg)   BMI 22.21 kg/m   Physical Exam  Assessment & Plan:   Problem List Items Addressed This Visit      Other   Diarrhea - Primary    Unclear etiology at this point.  His initial symptoms particularly triggered by stress sounded more like IBS.  Stress and then certain foods like pizza were a big trigger.  He does get abdominal pain around the time of the bowel movement.  He says the pain can last a couple hours after the bowel movement but then seems to ease off.  But the persistence of the diarrhea over the last couple of months is a little bit unusual for typical IBS D.  Thus I  would like to work him up further.  Will check for celiac disease as well as Crohn's disease.  We will check evaluate for nutritional deficiencies as well as anemia.  Also check liver enzymes.      Relevant Orders   CBC   COMPLETE METABOLIC PANEL WITH GFR   TSH   Hemoglobin A1c   Folate   Sedimentation rate   B12   Celiac Disease Panel   Stool culture   Clostridium difficile Toxin B, Qualitative, Real-Time PCR([facility])   C-reactive protein   Saccharomyces cerevisiae antibodies, IgG and IgA   Fe+TIBC+Fer   Giardia Antigen (Stool)   Abdominal pain    See note above.  We will also do stool cultures to evaluate for C. difficile, bacteria, and Giardia.      Relevant Orders   CBC   COMPLETE METABOLIC PANEL WITH GFR   TSH   Hemoglobin A1c   Folate   Sedimentation rate   B12   Celiac Disease Panel   Stool culture   Clostridium difficile Toxin B, Qualitative, Real-Time PCR([facility])   C-reactive protein   Saccharomyces cerevisiae antibodies, IgG and IgA   Fe+TIBC+Fer   Giardia Antigen (Stool)      Outpatient Encounter Medications as of 03/10/2019  Medication Sig  . [DISCONTINUED] hydrocortisone (ANUSOL-HC) 25 MG suppository Place 1 suppository (25 mg total) rectally 2 (two) times daily.  . [DISCONTINUED] predniSONE (DELTASONE) 20 MG tablet Take one tab by mouth twice daily for 4 days, then one daily. Take with food.   No facility-administered encounter medications on file as of 03/10/2019.     Follow-up: Return if symptoms worsen or fail to improve.     I discussed the assessment and treatment plan with the patient. The patient was provided an opportunity to ask questions and all were answered. The patient agreed with the plan and demonstrated an understanding of the instructions.   The patient was advised to call back or seek an in-person evaluation if the symptoms worsen or if the condition fails to improve as anticipated.     Beatrice Lecher, MD

## 2019-03-11 DIAGNOSIS — R109 Unspecified abdominal pain: Secondary | ICD-10-CM | POA: Diagnosis not present

## 2019-03-11 DIAGNOSIS — R197 Diarrhea, unspecified: Secondary | ICD-10-CM | POA: Diagnosis not present

## 2019-03-11 LAB — IRON,TIBC AND FERRITIN PANEL
%SAT: 16 % (calc) — ABNORMAL LOW (ref 20–48)
Ferritin: 86 ng/mL (ref 38–380)
Iron: 42 ug/dL — ABNORMAL LOW (ref 50–195)
TIBC: 263 mcg/dL (calc) (ref 250–425)

## 2019-03-14 LAB — HEMOGLOBIN A1C
Hgb A1c MFr Bld: 5.1 % of total Hgb (ref <5.7)
Mean Plasma Glucose: 100 (calc)
eAG (mmol/L): 5.5 (calc)

## 2019-03-14 LAB — COMPLETE METABOLIC PANEL WITH GFR
AG Ratio: 1.5 (calc) (ref 1.0–2.5)
ALT: 14 U/L (ref 9–46)
AST: 21 U/L (ref 10–40)
Albumin: 4.4 g/dL (ref 3.6–5.1)
Alkaline phosphatase (APISO): 64 U/L (ref 36–130)
BUN/Creatinine Ratio: 10 (calc) (ref 6–22)
BUN: 19 mg/dL (ref 7–25)
CO2: 24 mmol/L (ref 20–32)
Calcium: 9.8 mg/dL (ref 8.6–10.3)
Chloride: 99 mmol/L (ref 98–110)
Creat: 1.83 mg/dL — ABNORMAL HIGH (ref 0.60–1.35)
GFR, Est African American: 58 mL/min/{1.73_m2} — ABNORMAL LOW (ref >=60)
GFR, Est Non African American: 50 mL/min/{1.73_m2} — ABNORMAL LOW (ref >=60)
Globulin: 3 g/dL (calc) (ref 1.9–3.7)
Glucose, Bld: 81 mg/dL (ref 65–99)
Potassium: 4.6 mmol/L (ref 3.5–5.3)
Sodium: 136 mmol/L (ref 135–146)
Total Bilirubin: 0.5 mg/dL (ref 0.2–1.2)
Total Protein: 7.4 g/dL (ref 6.1–8.1)

## 2019-03-14 LAB — VITAMIN B12: Vitamin B-12: 659 pg/mL (ref 200–1100)

## 2019-03-14 LAB — CBC
HCT: 36.9 % — ABNORMAL LOW (ref 38.5–50.0)
Hemoglobin: 12.6 g/dL — ABNORMAL LOW (ref 13.2–17.1)
MCH: 30.6 pg (ref 27.0–33.0)
MCHC: 34.1 g/dL (ref 32.0–36.0)
MCV: 89.6 fL (ref 80.0–100.0)
MPV: 9.4 fL (ref 7.5–12.5)
Platelets: 306 10*3/uL (ref 140–400)
RBC: 4.12 10*6/uL — ABNORMAL LOW (ref 4.20–5.80)
RDW: 13.1 % (ref 11.0–15.0)
WBC: 4.9 10*3/uL (ref 3.8–10.8)

## 2019-03-14 LAB — CELIAC DISEASE PANEL
(tTG) Ab, IgA: 1 U/mL
(tTG) Ab, IgG: 2 U/mL
Gliadin IgA: 3 Units
Gliadin IgG: 2 Units
Immunoglobulin A: 190 mg/dL (ref 47–310)

## 2019-03-14 LAB — C-REACTIVE PROTEIN: CRP: 12.2 mg/L — ABNORMAL HIGH (ref <8.0)

## 2019-03-14 LAB — SACCHAROMYCES CEREVISIAE ANTIBODIES, IGG AND IGA
SACCHAROMYCES CEREVISIAE AB (ASCA)(IGA): 12.8 U (ref <=20.0)
SACCHAROMYCES CEREVISIAE AB (ASCA)(IGG): 21.7 U — ABNORMAL HIGH (ref <=20.0)

## 2019-03-14 LAB — FOLATE: Folate: 11.6 ng/mL

## 2019-03-14 LAB — TSH: TSH: 1.47 mIU/L (ref 0.40–4.50)

## 2019-03-14 LAB — SEDIMENTATION RATE: Sed Rate: 43 mm/h — ABNORMAL HIGH (ref 0–15)

## 2019-03-15 ENCOUNTER — Ambulatory Visit (INDEPENDENT_AMBULATORY_CARE_PROVIDER_SITE_OTHER): Payer: BC Managed Care – PPO | Admitting: Family Medicine

## 2019-03-15 ENCOUNTER — Other Ambulatory Visit: Payer: Self-pay

## 2019-03-15 ENCOUNTER — Encounter: Payer: Self-pay | Admitting: Gastroenterology

## 2019-03-15 ENCOUNTER — Encounter: Payer: Self-pay | Admitting: Family Medicine

## 2019-03-15 VITALS — BP 106/67 | HR 102 | Ht 74.0 in | Wt 169.0 lb

## 2019-03-15 DIAGNOSIS — R109 Unspecified abdominal pain: Secondary | ICD-10-CM

## 2019-03-15 DIAGNOSIS — R197 Diarrhea, unspecified: Secondary | ICD-10-CM

## 2019-03-15 DIAGNOSIS — R700 Elevated erythrocyte sedimentation rate: Secondary | ICD-10-CM | POA: Diagnosis not present

## 2019-03-15 DIAGNOSIS — E611 Iron deficiency: Secondary | ICD-10-CM | POA: Insufficient documentation

## 2019-03-15 DIAGNOSIS — K625 Hemorrhage of anus and rectum: Secondary | ICD-10-CM

## 2019-03-15 LAB — STOOL CULTURE
MICRO NUMBER:: 657698
MICRO NUMBER:: 657699
MICRO NUMBER:: 657700
SHIGA RESULT:: NOT DETECTED
SPECIMEN QUALITY:: ADEQUATE
SPECIMEN QUALITY:: ADEQUATE
SPECIMEN QUALITY:: ADEQUATE

## 2019-03-15 LAB — CLOSTRIDIUM DIFFICILE TOXIN B, QUALITATIVE, REAL-TIME PCR: Toxigenic C. Difficile by PCR: NOT DETECTED

## 2019-03-15 NOTE — Assessment & Plan Note (Signed)
Suspect IBS, though with labs findings I am concerned about IBD. Will refer to GI for further workup. I am concerned about the low iron and mildly elevated sed rate.  Will refer for further workup and likely colonoscopy.

## 2019-03-15 NOTE — Progress Notes (Signed)
Established Patient Office Visit  Subjective:  Patient ID: Miguel BrennerHunter King, male    DOB: 19-Dec-1995  Age: 23 y.o. MRN: [id number]  CC:  Chief Complaint  Patient presents with  . Results    HPI Miguel King presents for discuss and follow-up results of recent testing for persistent diarrhea.  In summary patient has report on and off diarrhea for years often triggered by stress but more recently over several months his diarrhea had become more persistent and gotten progressively worse.  He says almost every bowel movement now is soft or watery.  Occasionally has a foul odor like vomit and sometimes is more mucus present.  He denies any fever sweats or chills.  He has lost 10 pounds of the last year but feels like it is because he is no longer exercising and working out like he was at one time.  Occasionally will notice a little bit of blood with wiping but says it is normally bright red.  No family history of GI or autoimmune disorders.  He is not vegetarian.  He recently started a vitamin D and B supplement about 2 weeks ago.  He has never not had any type of GI work-up.  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  In fact he says he actually feels better today.  Reports test yesterday he had to go to the bathroom 4 times beforehand and did notice a little blood.   Past Medical History:  Diagnosis Date  . Anxiety   . History of concussion     No past surgical history on file.  No family history on file.  Social History   Socioeconomic History  . Marital status: Single    Spouse name: Not on file  . Number of children: Not on file  . Years of education: Not on file  . Highest education level: Not on file  Occupational History  . Not on file  Social Needs  . Financial resource strain: Not on file  . Food insecurity    Worry: Not on file    Inability: Not on file  . Transportation needs    Medical: Not  on file    Non-medical: Not on file  Tobacco Use  . Smoking status: Never Smoker  . Smokeless tobacco: Never Used  Substance and Sexual Activity  . Alcohol use: Yes    Alcohol/week: 2.0 standard drinks    Types: 2 Cans of beer per week  . Drug use: No  . Sexual activity: Not Currently  Lifestyle  . Physical activity    Days per week: Not on file    Minutes per session: Not on file  . Stress: Not on file  Relationships  . Social Musicianconnections    Talks on phone: Not on file    Gets together: Not on file    Attends religious service: Not on file    Active member of club or organization: Not on file    Attends meetings of clubs or organizations: Not on file    Relationship status: Not on file  . Intimate partner violence    Fear of current or ex partner: Not on file    Emotionally abused: Not on file    Physically abused: Not on file    Forced sexual activity: Not on file  Other Topics Concern  . Not on file  Social History Narrative  . Not  on file    No outpatient medications prior to visit.   No facility-administered medications prior to visit.     No Known Allergies  ROS Review of Systems    Objective:    Physical Exam  Constitutional: He is oriented to person, place, and time. He appears well-developed and well-nourished.  HENT:  Head: Normocephalic and atraumatic.  Cardiovascular: Normal rate, regular rhythm and normal heart sounds.  Pulmonary/Chest: Effort normal and breath sounds normal.  Abdominal: Soft. Bowel sounds are normal. He exhibits no distension and no mass. There is no abdominal tenderness. There is no rebound and no guarding.  Some fullness in the right lower quadrant.   Neurological: He is alert and oriented to person, place, and time.  Skin: Skin is warm and dry.  Psychiatric: He has a normal mood and affect. His behavior is normal.    BP 106/67   Pulse (!) 102   Ht 6\' 2"  (1.88 m)   Wt 169 lb (76.7 kg)   SpO2 100%   BMI 21.70 kg/m  Wt  Readings from Last 3 Encounters:  03/15/19 169 lb (76.7 kg)  03/10/19 173 lb (78.5 kg)  08/30/18 176 lb (79.8 kg)     Health Maintenance Due  Topic Date Due  . HIV Screening  02/13/2009    There are no preventive care reminders to display for this patient.  Lab Results  Component Value Date   TSH 1.47 03/10/2019   Lab Results  Component Value Date   WBC 4.9 03/10/2019   HGB 12.6 (L) 03/10/2019   HCT 36.9 (L) 03/10/2019   MCV 89.6 03/10/2019   PLT 306 03/10/2019   Lab Results  Component Value Date   NA 136 03/10/2019   K 4.6 03/10/2019   CO2 24 03/10/2019   GLUCOSE 81 03/10/2019   BUN 19 03/10/2019   CREATININE 1.83 (H) 03/10/2019   BILITOT 0.5 03/10/2019   AST 21 03/10/2019   ALT 14 03/10/2019   PROT 7.4 03/10/2019   CALCIUM 9.8 03/10/2019   No results found for: CHOL No results found for: HDL No results found for: LDLCALC No results found for: TRIG No results found for: CHOLHDL Lab Results  Component Value Date   HGBA1C 5.1 03/10/2019      Assessment & Plan:   Problem List Items Addressed This Visit      Digestive   Rectal bleeding    occ noticing bright red blood when bleeding        Other   Iron deficiency - Primary    He wants to start  MVI with iron that he has purchased. OK to take. After evaluation if caused determine we may need to increase his iron intake. Plan to recheck iron in 8 weeks.       Relevant Orders   Ambulatory referral to Gastroenterology   Diarrhea    Suspect IBS, though with labs findings I am concerned about IBD. Will refer to GI for further workup. I am concerned about the low iron and mildly elevated sed rate.  Will refer for further workup and likely colonoscopy.        Relevant Orders   Ambulatory referral to Gastroenterology   Abdominal pain   Relevant Orders   Ambulatory referral to Gastroenterology    Other Visit Diagnoses    Elevated sed rate       Relevant Orders   Ambulatory referral to  Gastroenterology      Suspect IBS, though with labs findings  I am concerned about IBD. Will refer to GI for further workup.   No orders of the defined types were placed in this encounter.   Follow-up: No follow-ups on file.    Nani Gasseratherine Metheney, MD

## 2019-03-15 NOTE — Assessment & Plan Note (Signed)
occ noticing bright red blood when bleeding

## 2019-03-15 NOTE — Assessment & Plan Note (Signed)
He wants to start  MVI with iron that he has purchased. OK to take. After evaluation if caused determine we may need to increase his iron intake. Plan to recheck iron in 8 weeks.

## 2019-03-16 ENCOUNTER — Ambulatory Visit: Payer: BC Managed Care – PPO | Admitting: Family Medicine

## 2019-03-30 ENCOUNTER — Other Ambulatory Visit: Payer: Self-pay

## 2019-03-30 ENCOUNTER — Telehealth (INDEPENDENT_AMBULATORY_CARE_PROVIDER_SITE_OTHER): Payer: BC Managed Care – PPO | Admitting: Gastroenterology

## 2019-03-30 ENCOUNTER — Encounter: Payer: Self-pay | Admitting: Gastroenterology

## 2019-03-30 VITALS — Ht 74.0 in | Wt 170.0 lb

## 2019-03-30 DIAGNOSIS — K921 Melena: Secondary | ICD-10-CM | POA: Diagnosis not present

## 2019-03-30 DIAGNOSIS — D509 Iron deficiency anemia, unspecified: Secondary | ICD-10-CM | POA: Diagnosis not present

## 2019-03-30 DIAGNOSIS — R197 Diarrhea, unspecified: Secondary | ICD-10-CM

## 2019-03-30 NOTE — Patient Instructions (Addendum)
If you are age 25 or older, your body mass index should be between 23-30. Your Body mass index is 21.83 kg/m. If this is out of the aforementioned range listed, please consider follow up with your Primary Care Provider.  If you are age 67 or younger, your body mass index should be between 19-25. Your Body mass index is 21.83 kg/m. If this is out of the aformentioned range listed, please consider follow up with your Primary Care Provider.   To help prevent the possible spread of infection to our patients, communities, and staff; we will be implementing the following measures:  As of now we are not allowing any visitors/family members to accompany you to any upcoming appointments with [facility]. If you have any concerns about this please contact our office to discuss prior to the appointment.   We have sent the following medications to your pharmacy for you to pick up at your convenience: Cleniq  It has been recommended to you by your physician that you have a(n) EGD/Colonoscopy completed. Per your request, we did not schedule the procedure(s) today. Please contact our office at [phone] should you decide to have the procedure completed.   It was a pleasure to see you today!  Vito Cirigliano, D.O.

## 2019-03-30 NOTE — Progress Notes (Signed)
Chief Complaint: Iron deficiency anemia, diarrhea, hematochezia  Referring Provider:     Hali Marry, MD   HPI:    Due to current restrictions/limitations of in-office visits due to the COVID-19 pandemic, this scheduled clinical appointment was converted to a telehealth virtual consultation using [employer].  -Time of medical discussion: 24 minutes -The patient did consent to this virtual visit and is aware of possible charges through their insurance for this visit.  -Names of all parties present: Miguel King (patient), Miguel Heck, DO, [facility] (physician) -Patient location: Home -Physician location: Office  Miguel King is a 23 y.o. male referred to the Gastroenterology Clinic for evaluation of iron deficiency anemia and diarrhea.  Has a longstanding history of intermittent watery stools for several years, typically triggered by stress, and worsening in the last several months. Describes 4-5 watery stools/day, +urgency. No nocturnal BM. +tenesmus that occurs during "flares". Does have mucus-like stools more recently.  Occasional small volume BRBPR.  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  Interestingly, reports feeling better since taking that test.  No n/v. Reports 10#weight loss which he attributes to decreased exercise (resistence training). No derm or ophtho issues. Thinks he may have had a hx of anal fissure in the past.   Recent labs notable for H/H 12.6/36.9 with MCV/RDW 89/13, iron 42, iron sat 16%, ferritin 86, TIBC 263.  Normal B12, folate.  Creatinine 1.83 with otherwise normal CMP.  Normal TSH, A1c, Celiac panel.  Elevated ESR (43) and CRP (12.2).  Elevated Saccharomyces cerevisiae.  Negative stool culture, C. difficile.  No prior studies for comparison.  No recent abdominal imaging.  Has started MVI with iron for a couple weeks.   No known family history of CRC, GI malignancy, liver disease, pancreatic disease, or IBD.   No previous  EGD or colonoscopy.  Past medical history, past surgical history, social history, family history, medications, and allergies reviewed in the chart and with patient.    Past Medical History:  Diagnosis Date  . Anxiety   . History of concussion      History reviewed. No pertinent surgical history. Family History  Problem Relation Age of Onset  . Colon cancer Neg Hx    Social History   Tobacco Use  . Smoking status: Never Smoker  . Smokeless tobacco: Never Used  Substance Use Topics  . Alcohol use: Yes    Alcohol/week: 2.0 standard drinks    Types: 2 Cans of beer per week  . Drug use: No   No current outpatient medications on file.   No current facility-administered medications for this visit.    No Known Allergies   Review of Systems: All systems reviewed and negative except where noted in HPI.     Physical Exam:    Complete physical exam not completed due to the nature of this telehealth communication.   Gen: Awake, alert, and oriented, and well communicative. HEENT: EOMI, non-icteric sclera, NCAT, MMM Neck: Normal movement of head and neck Pulm: No labored breathing, speaking in full sentences without conversational dyspnea Derm: No apparent lesions or bruising in visible field MS: Moves all visible extremities without noticeable abnormality Psych: Pleasant, cooperative, normal speech, thought processing seemingly intact   ASSESSMENT AND PLAN;   1) Iron Deficiency Anemia 2) Diarrhea 3) Elevated inflammatory markers 4) Hematochezia  Clinical presentation suspicious for IBD.  Certainly could have element of IBS given the strong association with stress, but  elevated inflammatory markers, hematochezia, IDA, suggestive of the former.  - EGD with biopsies and colonoscopy w/ biopsies -Okay to resume MVI with iron for now, but hold oral iron supplementation x7 days prior to endoscopic evaluation -Additional recommendations (i.e. VCE) pending endoscopic evaluation    The indications, risks, and benefits of EGD and colonoscopy were explained to the patient in detail. Risks include but are not limited to bleeding, perforation, adverse reaction to medications, and cardiopulmonary compromise. Sequelae include but are not limited to the possibility of surgery, hositalization, and mortality. The patient verbalized understanding and wished to proceed. All questions answered, referred to scheduler and bowel prep ordered. Further recommendations pending results of the exam.     Miguel Bullion, DO, FACG  03/30/2019, 1:34 PM   Miguel King, *

## 2023-02-20 ENCOUNTER — Ambulatory Visit
Admission: EM | Admit: 2023-02-20 | Discharge: 2023-02-20 | Disposition: A | Discharge status: Home / Self Care | Payer: 59

## 2023-02-20 ENCOUNTER — Encounter: Payer: Self-pay | Admitting: Emergency Medicine

## 2023-02-20 DIAGNOSIS — L03317 Cellulitis of buttock: Secondary | ICD-10-CM

## 2023-02-20 HISTORY — DX: Crohn's disease, unspecified, without complications: K50.90

## 2023-02-20 MED ORDER — CEPHALEXIN 500 MG PO CAPS: 500.0000 mg | ORAL_CAPSULE | Freq: Two times a day (BID) | ORAL | 0 refills | Status: AC

## 2023-02-20 NOTE — Discharge Instructions (Addendum)
Advised patient of his cellulitis and need to take Keflex twice daily x 7 days with this being the minimum dose of antibiotic for skin infection for his body weight.  Advised patient to take medication with food to completion.  Encouraged increase daily water intake to 64 ounces per day while taking this medication.  Advised if symptoms worsen and/or unresolved please follow-up with PCP or here for further evaluation.

## 2023-02-20 NOTE — ED Provider Notes (Signed)
[facility]    CSN: [id number] Arrival date & time: 02/20/23  6962      History   Chief Complaint Chief Complaint  Patient presents with   Rash    HPI Miguel King is a 27 y.o. male.   HPI Pleasant 27 year old male presents with erythematous skin patch at left top buttock patient noted it on Tuesday, itches intermittently topical cortisone and Benadryl has helped. Patient is immunosuppressed.  PMH significant for Crohn's disease, history of concussion, and rectal bleeding.  Past Medical History:  Diagnosis Date   Anxiety    Crohn's disease (HCC)    History of concussion     Patient Active Problem List   Diagnosis Date Noted   Iron deficiency 03/15/2019   Rectal bleeding 03/15/2019   Diarrhea 03/10/2019   Abdominal pain 03/10/2019   FRACTURE, FINGER 03/19/2009    History reviewed. No pertinent surgical history.     Home Medications    Prior to Admission medications   Medication Sig Start Date End Date Taking? Authorizing Provider  cephALEXin (KEFLEX) 500 MG capsule Take 1 capsule (500 mg total) by mouth 2 (two) times daily for 7 days. 02/20/23 02/27/23 Yes Trevor Iha, FNP  INFLECTRA 100 MG SOLR Administer 400mg  IV @ 0, 2, 6 weeks and every 8 weeks thereafter 11/06/21  Yes [provider]    Family History Family History  Problem Relation Age of Onset   Healthy Mother    Healthy Father    Colon cancer Neg Hx     Social History Social History   Tobacco Use   Smoking status: Never   Smokeless tobacco: Never  Vaping Use   Vaping Use: Never used  Substance Use Topics   Alcohol use: Yes    Alcohol/week: 2.0 standard drinks of alcohol    Types: 2 Cans of beer per week   Drug use: No     Allergies   Patient has no known allergies.   Review of Systems Review of Systems  Skin:  Positive for rash.     Physical Exam Triage Vital Signs ED Triage Vitals  Enc Vitals Group     BP 02/20/23 0844 108/74     Pulse Rate  02/20/23 0844 77     Resp 02/20/23 0844 14     Temp 02/20/23 0844 98.4 F (36.9 C)     Temp Source 02/20/23 0844 Oral     SpO2 02/20/23 0844 100 %     Weight 02/20/23 0846 180 lb (81.6 kg)     Height 02/20/23 0846 6\' 2"  (1.88 m)     Head Circumference --      Peak Flow --      Pain Score 02/20/23 0846 0     Pain Loc --      Pain Edu? --      Excl. in GC? --    No data found.  Updated Vital Signs BP 108/74 (BP Location: Left Arm)   Pulse 77   Temp 98.4 F (36.9 C) (Oral)   Resp 14   Ht 6\' 2"  (1.88 m)   Wt 180 lb (81.6 kg)   SpO2 100%   BMI 23.11 kg/m     Physical Exam Vitals and nursing note reviewed.  Constitutional:      Appearance: Normal appearance. He is normal weight.  HENT:     Head: Normocephalic and atraumatic.     Mouth/Throat:     Mouth: Mucous membranes are moist.  Pharynx: Oropharynx is clear.  Eyes:     Extraocular Movements: Extraocular movements intact.     Conjunctiva/sclera: Conjunctivae normal.     Pupils: Pupils are equal, round, and reactive to light.  Cardiovascular:     Rate and Rhythm: Normal rate and regular rhythm.     Pulses: Normal pulses.     Heart sounds: Normal heart sounds.  Pulmonary:     Effort: Pulmonary effort is normal.     Breath sounds: Normal breath sounds. No wheezing, rhonchi or rales.  Musculoskeletal:        General: Normal range of motion.     Cervical back: Normal range of motion and neck supple.  Skin:    General: Skin is warm and dry.     Comments: Left buttock area (superior lateral aspect): Erythematous, mildly indurated plaque  Neurological:     General: No focal deficit present.     Mental Status: He is alert and oriented to person, place, and time. Mental status is at baseline.  Psychiatric:        Mood and Affect: Mood normal.        Behavior: Behavior normal.      UC Treatments / Results  Labs (all labs ordered are listed, but only abnormal results are displayed) Labs Reviewed - No data to  display  EKG   Radiology No results found.  Procedures Procedures (including critical care time)  Medications Ordered in UC Medications - No data to display  Initial Impression / Assessment and Plan / UC Course  I have reviewed the triage vital signs and the nursing notes.  Pertinent labs & imaging results that were available during my care of the patient were reviewed by me and considered in my medical decision making (see chart for details).     MDM: 1.  Cellulitis of buttocks, left-Rx'd Keflex 500 mg capsule twice daily x 7 days. Advised patient of his cellulitis and need to take Keflex twice daily x 7 days with this being the minimum dose of antibiotic for skin infection for his body weight.  Advised patient to take medication with food to completion.  Encouraged increase daily water intake to 64 ounces per day while taking this medication.  Advised if symptoms worsen and/or unresolved please follow-up with PCP or here for further evaluation.  Patient discharged home, hemodynamically stable.  Final Clinical Impressions(s) / UC Diagnoses   Final diagnoses:  Cellulitis of buttock, left     Discharge Instructions      Advised patient of his cellulitis and need to take Keflex twice daily x 7 days with this being the minimum dose of antibiotic for skin infection for his body weight.  Advised patient to take medication with food to completion.  Encouraged increase daily water intake to 64 ounces per day while taking this medication.  Advised if symptoms worsen and/or unresolved please follow-up with PCP or here for further evaluation.     ED Prescriptions     Medication Sig Dispense Auth. Provider   cephALEXin (KEFLEX) 500 MG capsule Take 1 capsule (500 mg total) by mouth 2 (two) times daily for 7 days. 14 capsule Trevor Iha, FNP      PDMP not reviewed this encounter.   Trevor Iha, FNP 02/20/23 [phone]

## 2023-02-20 NOTE — ED Triage Notes (Signed)
Hive to left top of buttock Pt  noted it on Tuesday Itches intermittently  Topical cortisone and benadryl has helped Itches intermittently Pt is immunosuppressed

## 2023-03-03 ENCOUNTER — Encounter: Payer: Self-pay | Admitting: Emergency Medicine

## 2023-03-03 ENCOUNTER — Ambulatory Visit
Admission: EM | Admit: 2023-03-03 | Discharge: 2023-03-03 | Disposition: A | Discharge status: Home / Self Care | Payer: 59 | Attending: Family Medicine | Admitting: Family Medicine

## 2023-03-03 DIAGNOSIS — R21 Rash and other nonspecific skin eruption: Secondary | ICD-10-CM | POA: Diagnosis not present

## 2023-03-03 MED ORDER — MUPIROCIN 2 % EX OINT: 1.0000 | TOPICAL_OINTMENT | Freq: Two times a day (BID) | CUTANEOUS | 0 refills | Status: AC

## 2023-03-03 MED ORDER — DOXYCYCLINE HYCLATE 100 MG PO CAPS: 100.0000 mg | ORAL_CAPSULE | Freq: Two times a day (BID) | ORAL | 1 refills | Status: AC

## 2023-03-03 NOTE — ED Provider Notes (Signed)
[facility]    CSN: [id number] Arrival date & time: 03/03/23  1122      History   Chief Complaint Chief Complaint  Patient presents with   Rash    HPI Miguel King is a 27 y.o. male.   HPI  Patient was seen about a week ago for skin infection/cellulitis.  Record is reviewed   He did well on the antibiotic and the rash looked clear.  Within a couple of days it has recurred.  Slightly irritated but not painful. Never had this rash before.  Seems worse after being out itn the heat  Past Medical History:  Diagnosis Date   Anxiety    Crohn's disease (HCC)    History of concussion     Patient Active Problem List   Diagnosis Date Noted   Iron deficiency 03/15/2019   Rectal bleeding 03/15/2019   Diarrhea 03/10/2019   Abdominal pain 03/10/2019   FRACTURE, FINGER 03/19/2009    History reviewed. No pertinent surgical history.     Home Medications    Prior to Admission medications   Medication Sig Start Date End Date Taking? Authorizing Provider  doxycycline (VIBRAMYCIN) 100 MG capsule Take 1 capsule (100 mg total) by mouth 2 (two) times daily. 03/03/23  Yes Eustace Moore, MD  mupirocin ointment (BACTROBAN) 2 % Apply 1 Application topically 2 (two) times daily. 03/03/23  Yes Eustace Moore, MD  INFLECTRA 100 MG SOLR Administer 400mg  IV @ 0, 2, 6 weeks and every 8 weeks thereafter 11/06/21   [provider]    Family History Family History  Problem Relation Age of Onset   Healthy Mother    Healthy Father    Colon cancer Neg Hx     Social History Social History   Tobacco Use   Smoking status: Never   Smokeless tobacco: Never  Vaping Use   Vaping Use: Never used  Substance Use Topics   Alcohol use: Yes    Alcohol/week: 2.0 standard drinks of alcohol    Types: 2 Cans of beer per week   Drug use: No     Allergies   Patient has no known allergies.   Review of Systems Review of Systems See HPI  Physical Exam Triage Vital  Signs ED Triage Vitals  Enc Vitals Group     BP 03/03/23 1132 128/86     Pulse Rate 03/03/23 1132 66     Resp 03/03/23 1132 16     Temp 03/03/23 1132 97.9 F (36.6 C)     Temp Source 03/03/23 1132 Oral     SpO2 03/03/23 1132 99 %     Weight --      Height --      Head Circumference --      Peak Flow --      Pain Score 03/03/23 1133 0     Pain Loc --      Pain Edu? --      Excl. in GC? --    No data found.  Updated Vital Signs BP 128/86 (BP Location: Right Arm)   Pulse 66   Temp 97.9 F (36.6 C) (Oral)   Resp 16   SpO2 99%       Physical Exam Constitutional:      General: He is not in acute distress.    Appearance: He is well-developed.  HENT:     Head: Normocephalic and atraumatic.  Eyes:     Conjunctiva/sclera: Conjunctivae normal.  Pupils: Pupils are equal, round, and reactive to light.  Cardiovascular:     Rate and Rhythm: Normal rate.  Pulmonary:     Effort: Pulmonary effort is normal. No respiratory distress.  Musculoskeletal:        General: Normal range of motion.     Cervical back: Normal range of motion.  Skin:    General: Skin is warm and dry.     Findings: Rash present.     Comments: Rash is as pictured, fainter in appearance but slightly larger in diameter.  Erythema and slight induration.  Non tender.  No vesicle or open skin  Neurological:     Mental Status: He is alert.      UC Treatments / Results  Labs (all labs ordered are listed, but only abnormal results are displayed) Labs Reviewed - No data to display  EKG   Radiology No results found.  Procedures Procedures (including critical care time)  Medications Ordered in UC Medications - No data to display  Initial Impression / Assessment and Plan / UC Course  I have reviewed the triage vital signs and the nursing notes.  Pertinent labs & imaging results that were available during my care of the patient were reviewed by me and considered in my medical decision making (see  chart for details).     Will ttry different approach Does not look viral Final Clinical Impressions(s) / UC Diagnoses   Final diagnoses:  Rash and nonspecific skin eruption     Discharge Instructions      I have prescribed 2 medicines for the skin infection 1st: try the mupirocin ointment 2 x a day for a couple of days.  This is a stronger antibiotic ointment that will treat skin infections. 2nd: if you fail to see improvement, or if rash worsens, fill and take the doxycycline.  Take with food.  Continue your probiotics      ED Prescriptions     Medication Sig Dispense Auth. Provider   mupirocin ointment (BACTROBAN) 2 % Apply 1 Application topically 2 (two) times daily. 22 g Eustace Moore, MD   doxycycline (VIBRAMYCIN) 100 MG capsule Take 1 capsule (100 mg total) by mouth 2 (two) times daily. 14 capsule Eustace Moore, MD      PDMP not reviewed this encounter.   Eustace Moore, MD 03/03/23 1159

## 2023-03-03 NOTE — Discharge Instructions (Signed)
I have prescribed 2 medicines for the skin infection 1st: try the mupirocin ointment 2 x a day for a couple of days.  This is a stronger antibiotic ointment that will treat skin infections. 2nd: if you fail to see improvement, or if rash worsens, fill and take the doxycycline.  Take with food.  Continue your probiotics

## 2023-03-03 NOTE — ED Triage Notes (Signed)
Pt seen a few weeks ago for cellulitis to buttocks. States he completed the keflex on Friday and yesterday noticed rash was coming back in same spot. Denies pain but states it is itching.

## 2023-07-17 ENCOUNTER — Ambulatory Visit
Admission: EM | Admit: 2023-07-17 | Discharge: 2023-07-17 | Disposition: A | Discharge status: Home / Self Care | Payer: 59

## 2023-07-17 DIAGNOSIS — N50811 Right testicular pain: Secondary | ICD-10-CM

## 2023-07-17 HISTORY — DX: Encounter for other specified aftercare: Z51.89

## 2023-07-17 NOTE — ED Provider Notes (Signed)
[facility]    CSN: [id number] Arrival date & time: 07/17/23  1524      History   Chief Complaint Chief Complaint  Patient presents with   Testicle Pain    HPI Miguel King is a 27 y.o. male.   HPI 27 year old male presents with presents with pain of right testicle.  Patient reports has been running a lot and is unsure if this has to do with right testicle pain.  PMH significant for anxiety and Crohn's disease.  Past Medical History:  Diagnosis Date   Anxiety    Blood transfusion without reported diagnosis    Crohn's disease (HCC)    History of concussion     Patient Active Problem List   Diagnosis Date Noted   Iron deficiency 03/15/2019   Rectal bleeding 03/15/2019   Diarrhea 03/10/2019   Abdominal pain 03/10/2019   FRACTURE, FINGER 03/19/2009    History reviewed. No pertinent surgical history.     Home Medications    Prior to Admission medications   Medication Sig Start Date End Date Taking? Authorizing Provider  doxycycline (VIBRAMYCIN) 100 MG capsule Take 1 capsule (100 mg total) by mouth 2 (two) times daily. 03/03/23   Eustace Moore, MD  INFLECTRA 100 MG SOLR Administer 400mg  IV @ 0, 2, 6 weeks and every 8 weeks thereafter 11/06/21   [provider]  mupirocin ointment (BACTROBAN) 2 % Apply 1 Application topically 2 (two) times daily. 03/03/23   Eustace Moore, MD    Family History Family History  Problem Relation Age of Onset   Healthy Mother    Healthy Father    Colon cancer Neg Hx     Social History Social History   Tobacco Use   Smoking status: Never   Smokeless tobacco: Never  Vaping Use   Vaping status: Never Used  Substance Use Topics   Alcohol use: Yes    Alcohol/week: 2.0 standard drinks of alcohol    Types: 2 Cans of beer per week   Drug use: No     Allergies   Patient has no known allergies.   Review of Systems Review of Systems  Genitourinary:  Positive for testicular pain.     Physical  Exam Triage Vital Signs ED Triage Vitals  Encounter Vitals Group     BP 07/17/23 1544 121/86     Systolic BP Percentile --      Diastolic BP Percentile --      Pulse Rate 07/17/23 1544 68     Resp 07/17/23 1544 16     Temp 07/17/23 1544 98.3 F (36.8 C)     Temp src --      SpO2 07/17/23 1544 98 %     Weight --      Height --      Head Circumference --      Peak Flow --      Pain Score 07/17/23 1542 1     Pain Loc --      Pain Education --      Exclude from Growth Chart --    No data found.  Updated Vital Signs BP 121/86   Pulse 68   Temp 98.3 F (36.8 C)   Resp 16   SpO2 98%   Visual Acuity Right Eye Distance:   Left Eye Distance:   Bilateral Distance:    Right Eye Near:   Left Eye Near:    Bilateral Near:     Physical Exam Vitals and  nursing note reviewed.  Constitutional:      Appearance: Normal appearance. He is normal weight.  HENT:     Head: Normocephalic and atraumatic.     Mouth/Throat:     Mouth: Mucous membranes are moist.     Pharynx: Oropharynx is clear.  Eyes:     Extraocular Movements: Extraocular movements intact.     Conjunctiva/sclera: Conjunctivae normal.     Pupils: Pupils are equal, round, and reactive to light.  Cardiovascular:     Rate and Rhythm: Normal rate and regular rhythm.     Pulses: Normal pulses.     Heart sounds: Normal heart sounds.  Pulmonary:     Effort: Pulmonary effort is normal.     Breath sounds: Normal breath sounds. No wheezing, rhonchi or rales.  Genitourinary:    Penis: Normal.      Testes: Normal.     Comments: Right testicle very mild discomfort noted on palpation, no deformity noted, no soft tissue swelling, nonerythematous Musculoskeletal:        General: Normal range of motion.     Cervical back: Normal range of motion and neck supple.  Skin:    General: Skin is warm and dry.  Neurological:     General: No focal deficit present.     Mental Status: He is alert and oriented to person, place, and  time. Mental status is at baseline.  Psychiatric:        Mood and Affect: Mood normal.        Behavior: Behavior normal.      UC Treatments / Results  Labs (all labs ordered are listed, but only abnormal results are displayed) Labs Reviewed - No data to display  EKG   Radiology No results found.  Procedures Procedures (including critical care time)  Medications Ordered in UC Medications - No data to display  Initial Impression / Assessment and Plan / UC Course  I have reviewed the triage vital signs and the nursing notes.  Pertinent labs & imaging results that were available during my care of the patient were reviewed by me and considered in my medical decision making (see chart for details).     MDM: 1.  Testicular pain, right-exam unremarkable this evening. Advised patient exam was within normal limits this evening.  Encouraged patient to increase daily water intake to 64 ounces per day 7 days/week.  Advised if symptoms worsen and/or unresolved please follow-up with PCP or here for further evaluation.  Patient discharged home, hemodynamically stable. Final Clinical Impressions(s) / UC Diagnoses   Final diagnoses:  Testicular pain, right     Discharge Instructions      Advised patient exam was within normal limits this evening.  Encouraged patient to increase daily water intake to 64 ounces per day 7 days/week.  Advised if symptoms worsen and/or unresolved please follow-up with PCP or here for further evaluation.     ED Prescriptions   None    PDMP not reviewed this encounter.   Trevor Iha, FNP 07/17/23 1630

## 2023-07-17 NOTE — Discharge Instructions (Addendum)
Advised patient exam was within normal limits this evening.  Encouraged patient to increase daily water intake to 64 ounces per day 7 days/week.  Advised if symptoms worsen and/or unresolved please follow-up with PCP or here for further evaluation.

## 2023-07-17 NOTE — ED Triage Notes (Signed)
Pt presents to uc with co of dull pain in right testicle. Pt reports he has been running a lot and is not sure if it is correlated to that. Pt reports no increase in pain with movement.
